# Patient Record
Sex: Male | Born: 1965 | Race: White | Hispanic: No | Marital: Married
Health system: Southern US, Community
[De-identification: ages and names within clinical notes are randomized; demographics above are authoritative.]

---

## 2007-06-15 ENCOUNTER — Encounter: Admission: RE | Admit: 2007-06-15 | Discharge: 2007-06-15 | Payer: Self-pay | Admitting: Otolaryngology

## 2009-01-23 IMAGING — CT CT PARANASAL SINUSES LIMITED
1 series · 16 of 24 positions shown, 20 images · IV contrast (agent unspecified)
Comparison: None.

CLINICAL DATA: Congestion. 
 CT SINUS LIMITED WITHOUT CONTRAST:
TECHNIQUE: Limited coronal CT images were obtained through the paranasal sinuses without intravenous contrast.

[Series 2: limited sinus · axial · 0.33mm/px · z∈[-4,+101]mm · 16 of 24 slices shown, 20 images]
[im 2/24  brain]
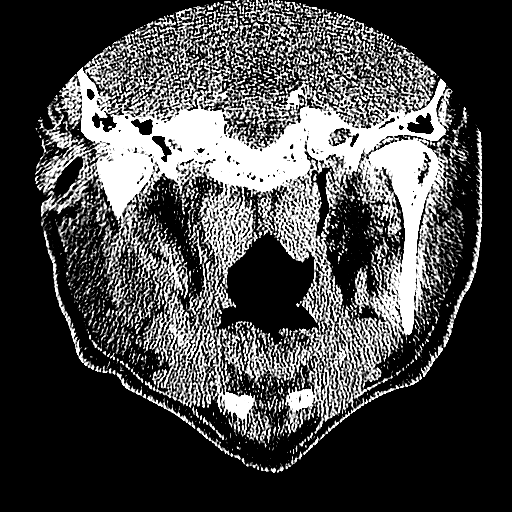
[im 2/24  bone]
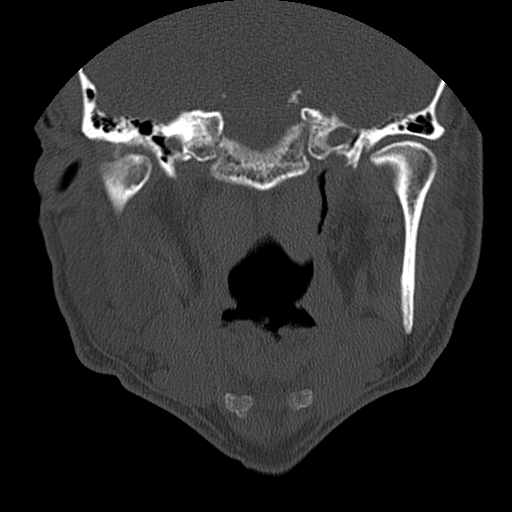
[im 4/24  bone]
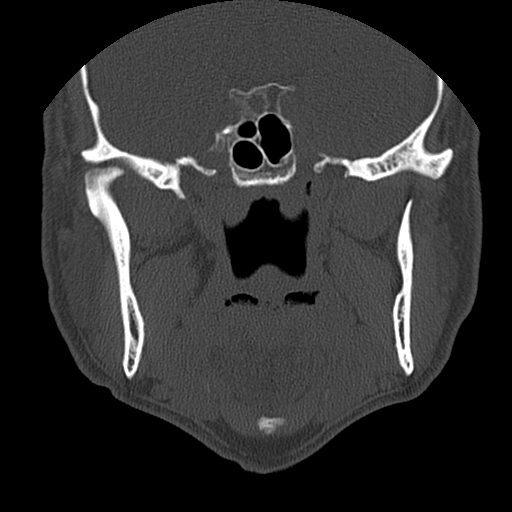
[im 5/24  bone]
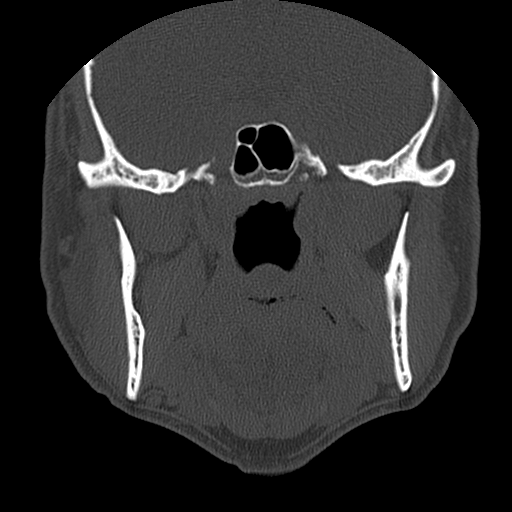
[im 6/24  bone]
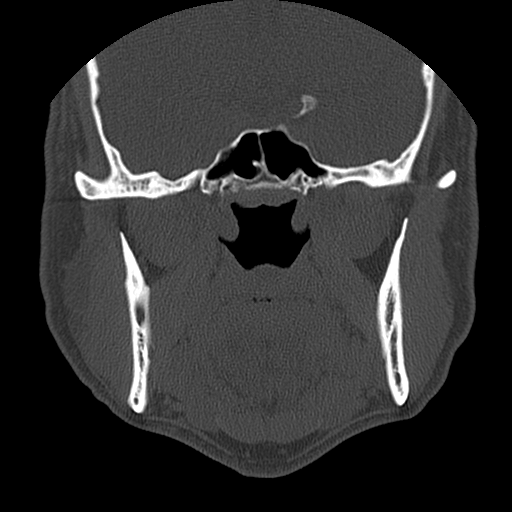
[im 8/24  brain]
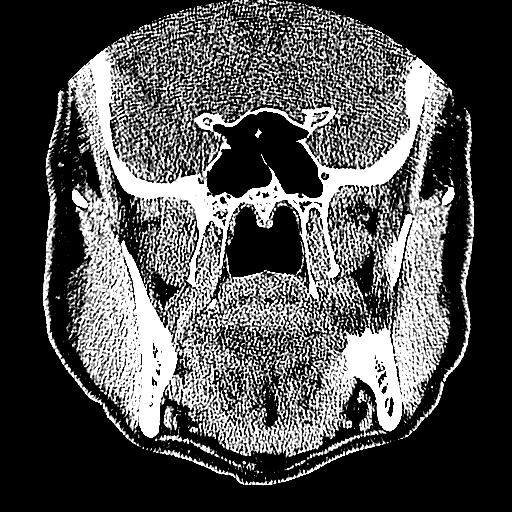
[im 8/24  bone]
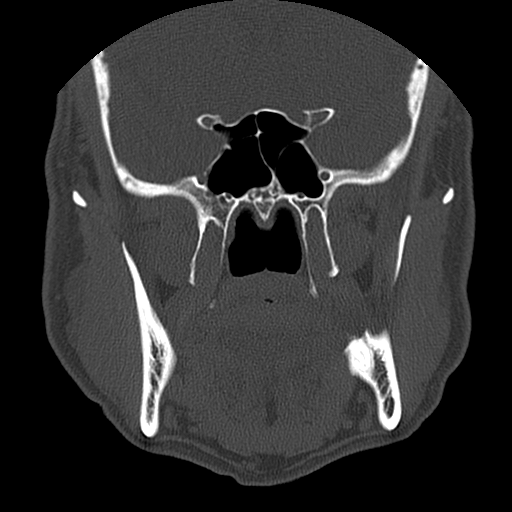
[im 9/24  bone]
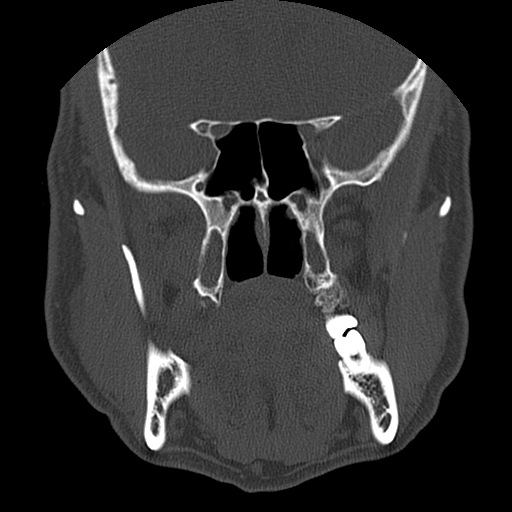
[im 10/24  bone]
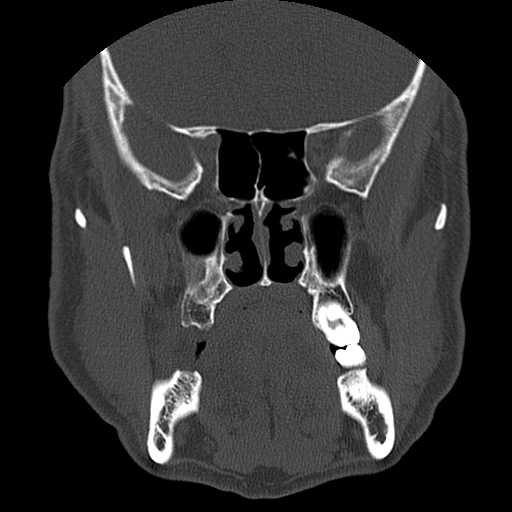
[im 12/24  bone]
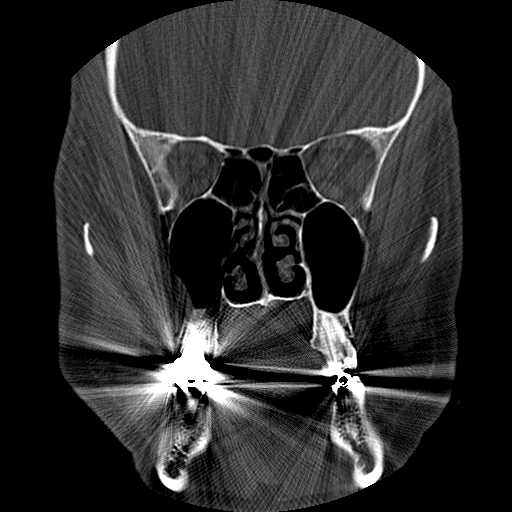
[im 13/24  brain]
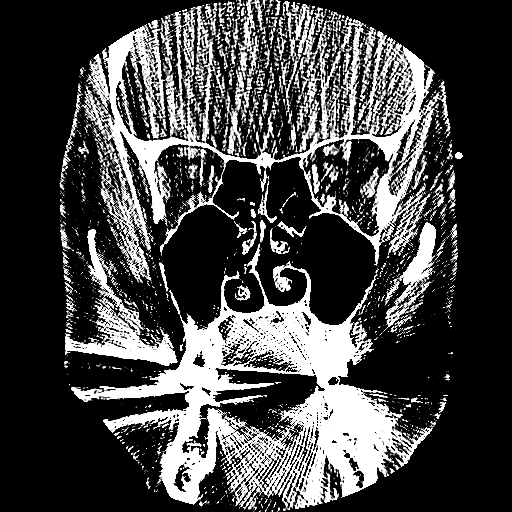
[im 13/24  bone]
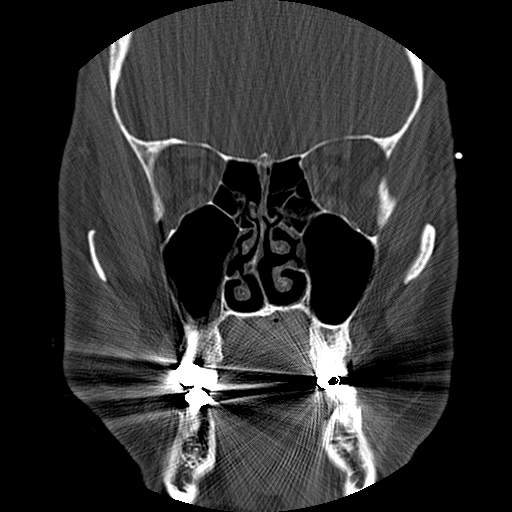
[im 15/24  bone]
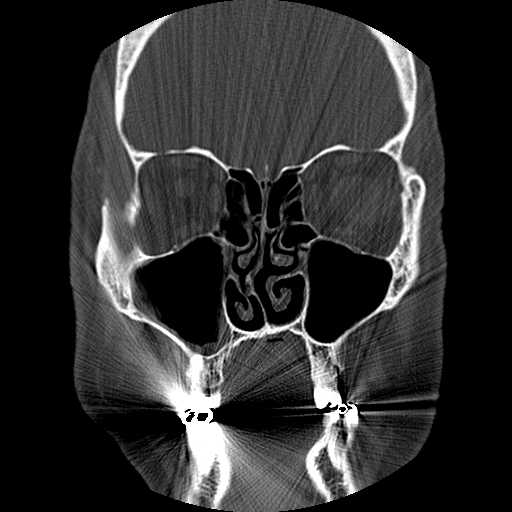
[im 16/24  bone]
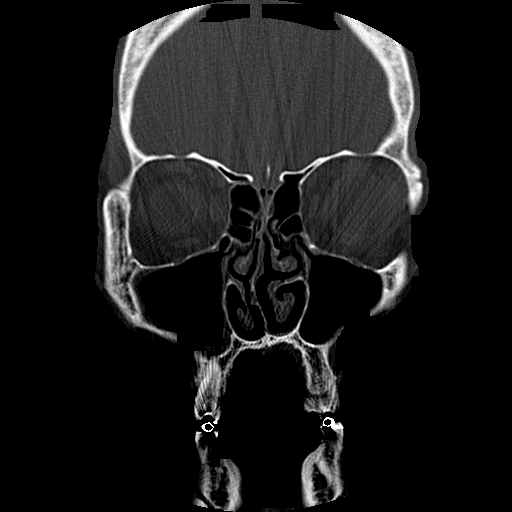
[im 17/24  bone]
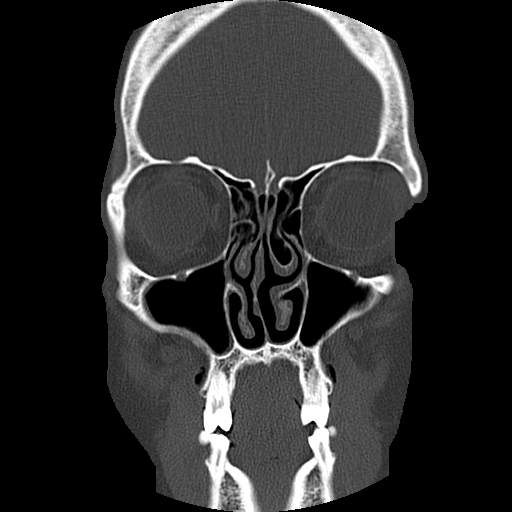
[im 19/24  brain]
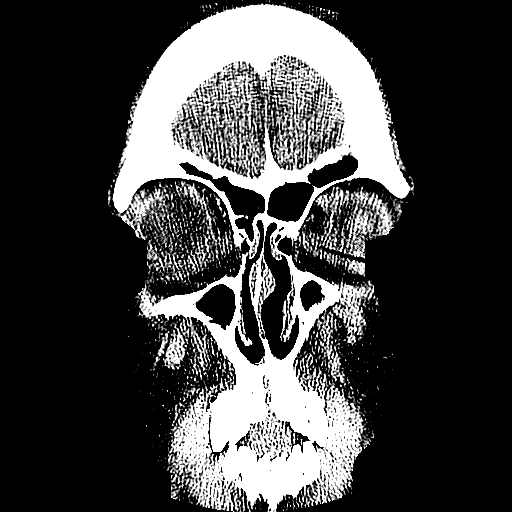
[im 19/24  bone]
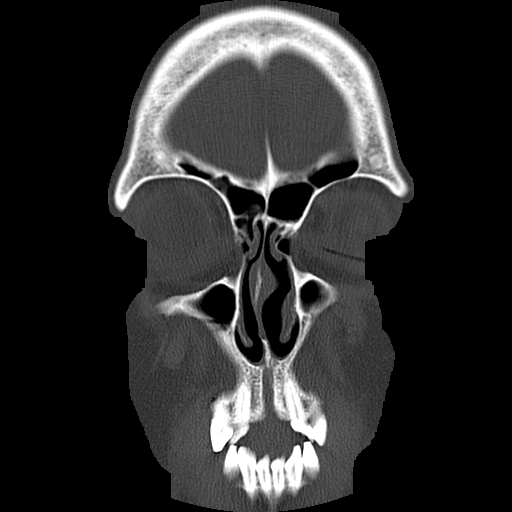
[im 20/24  bone]
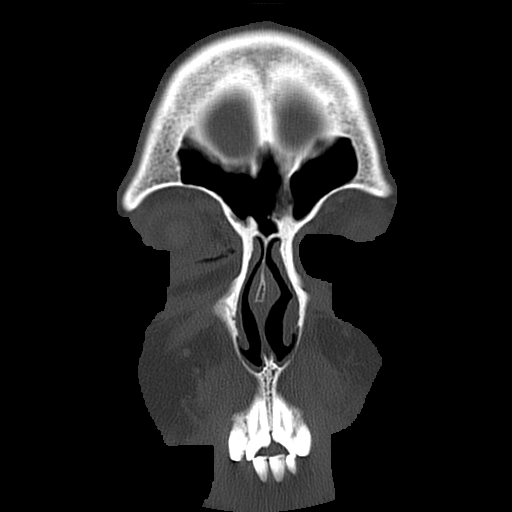
[im 21/24  bone]
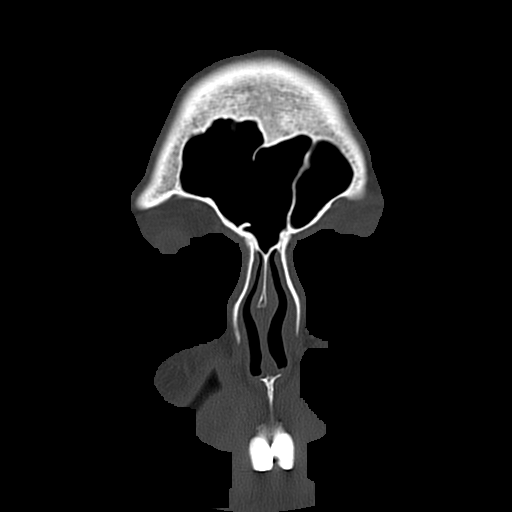
[im 23/24  bone]
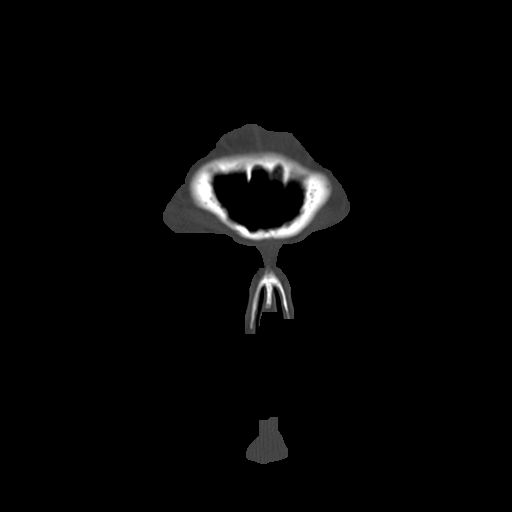

[16 of 24 positions shown; findings below may reference images not displayed]

FINDINGS: Mild mucosal thickening is seen in the left maxillary sinus.  There is mild mucosal thickening in the osteomeatal complex bilaterally.  No significant mucosal thickening elsewhere.  No air fluid levels are seen.  The nasal septum shows deviation to the left.  No bony changes are identified.
IMPRESSION: Chronic sinusitis with mucosal edema in the left maxillary sinus and in the osteomeatal complex bilaterally.

## 2012-11-24 ENCOUNTER — Ambulatory Visit (HOSPITAL_BASED_OUTPATIENT_CLINIC_OR_DEPARTMENT_OTHER)
Admission: RE | Admit: 2012-11-24 | Discharge: 2012-11-24 | Disposition: A | Discharge status: Home / Self Care | Payer: BC Managed Care – PPO | Source: Ambulatory Visit | Attending: Orthopedic Surgery | Admitting: Orthopedic Surgery

## 2012-11-24 ENCOUNTER — Encounter (HOSPITAL_BASED_OUTPATIENT_CLINIC_OR_DEPARTMENT_OTHER)
Admission: RE | Disposition: A | Discharge status: Home / Self Care | Payer: Self-pay | Source: Ambulatory Visit | Attending: Orthopedic Surgery

## 2012-11-24 ENCOUNTER — Encounter (HOSPITAL_BASED_OUTPATIENT_CLINIC_OR_DEPARTMENT_OTHER): Payer: Self-pay | Admitting: Orthopedic Surgery

## 2012-11-24 DIAGNOSIS — Y92009 Unspecified place in unspecified non-institutional (private) residence as the place of occurrence of the external cause: Secondary | ICD-10-CM | POA: Insufficient documentation

## 2012-11-24 DIAGNOSIS — W268XXA Contact with other sharp object(s), not elsewhere classified, initial encounter: Secondary | ICD-10-CM | POA: Insufficient documentation

## 2012-11-24 DIAGNOSIS — S61209A Unspecified open wound of unspecified finger without damage to nail, initial encounter: Secondary | ICD-10-CM | POA: Insufficient documentation

## 2012-11-24 HISTORY — PX: LESION REMOVAL: SHX5196

## 2012-11-24 SURGERY — MINOR EXCISION OF LESION
Anesthesia: LOCAL | Site: Finger | Laterality: Right | Wound class: Dirty or Infected

## 2012-11-24 MED ORDER — SULFAMETHOXAZOLE-TRIMETHOPRIM 800-160 MG PO TABS: 1.0000 | ORAL_TABLET | Freq: Two times a day (BID) | ORAL | Status: DC

## 2012-11-24 MED ORDER — HYDROCODONE-ACETAMINOPHEN 5-325 MG PO TABS: ORAL_TABLET | ORAL | Status: DC

## 2012-11-24 MED ORDER — LIDOCAINE HCL 1 % IJ SOLN
INTRAMUSCULAR | Status: DC | PRN
  Administered 2012-11-24: 15:00:00

## 2012-11-24 SURGICAL SUPPLY — 26 items
BANDAGE COBAN STERILE 2 (GAUZE/BANDAGES/DRESSINGS) ×2 IMPLANT
BLADE SURG 15 STRL LF DISP TIS (BLADE) ×1 IMPLANT
BLADE SURG 15 STRL SS (BLADE) ×1
CORDS BIPOLAR (ELECTRODE) ×2 IMPLANT
COVER MAYO STAND STRL (DRAPES) ×2 IMPLANT
COVER TABLE BACK 60X90 (DRAPES) ×2 IMPLANT
DRAIN PENROSE 1/4X12 LTX STRL (WOUND CARE) ×2 IMPLANT
DRAPE EXTREMITY T 121X128X90 (DRAPE) ×2 IMPLANT
GAUZE XEROFORM 1X8 LF (GAUZE/BANDAGES/DRESSINGS) ×2 IMPLANT
GLOVE BIO SURGEON STRL SZ 6.5 (GLOVE) ×2 IMPLANT
GLOVE BIO SURGEON STRL SZ7 (GLOVE) ×2 IMPLANT
GLOVE BIO SURGEON STRL SZ7.5 (GLOVE) ×2 IMPLANT
GLOVE INDICATOR 7.0 STRL GRN (GLOVE) ×2 IMPLANT
GOWN BRE IMP PREV XXLGXLNG (GOWN DISPOSABLE) ×2 IMPLANT
GOWN PREVENTION PLUS XLARGE (GOWN DISPOSABLE) ×4 IMPLANT
NEEDLE HYPO 25X1 1.5 SAFETY (NEEDLE) ×2 IMPLANT
NS IRRIG 1000ML POUR BTL (IV SOLUTION) ×2 IMPLANT
PACK BASIN DAY SURGERY FS (CUSTOM PROCEDURE TRAY) ×2 IMPLANT
SPONGE GAUZE 4X4 12PLY (GAUZE/BANDAGES/DRESSINGS) ×2 IMPLANT
STOCKINETTE 4X48 STRL (DRAPES) ×2 IMPLANT
SUT MNCRL 6-0 UNDY P1 1X18 (SUTURE) ×1 IMPLANT
SUT MONOCRYL 6-0 P1 1X18 (SUTURE) ×1
SYR BULB 3OZ (MISCELLANEOUS) ×2 IMPLANT
SYR CONTROL 10ML LL (SYRINGE) ×2 IMPLANT
TRAY DSU PREP LF (CUSTOM PROCEDURE TRAY) ×2 IMPLANT
UNDERPAD 30X30 INCONTINENT (UNDERPADS AND DIAPERS) ×2 IMPLANT

## 2012-11-24 NOTE — Op Note (Signed)
[id number]

## 2012-11-24 NOTE — Brief Op Note (Signed)
11/24/2012  3:43 PM  PATIENT:  Shannon Yoder  47 y.o. male  PRE-OPERATIVE DIAGNOSIS:  right middle finger laceration   POST-OPERATIVE DIAGNOSIS:  right middle finger laceration   PROCEDURE:  Procedure(s) (LRB) with comments: MINOR EXICISION OF LESION (Right) - laceration repair right middle finger  SURGEON:  Surgeon(s) and Role:    * Tami Ribas, MD - Primary  PHYSICIAN ASSISTANT:   ASSISTANTS: none   ANESTHESIA:   local  EBL:     BLOOD ADMINISTERED:none  DRAINS: none   LOCAL MEDICATIONS USED:  MARCAINE   , XYLOCAINE   SPECIMEN:  No Specimen  DISPOSITION OF SPECIMEN:  N/A  COUNTS:  YES  TOURNIQUET:  * No tourniquets in log *  DICTATION: .Other Dictation: Dictation Number [id number]  PLAN OF CARE: Discharge to home after PACU  PATIENT DISPOSITION:  home
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
## 2012-11-25 ENCOUNTER — Encounter (HOSPITAL_BASED_OUTPATIENT_CLINIC_OR_DEPARTMENT_OTHER): Payer: Self-pay | Admitting: Orthopedic Surgery

## 2012-11-25 NOTE — Op Note (Signed)
NAMECHRIST, Shannon NO.:  [id number]  MEDICAL RECORD NO.:  [id number]  LOCATION:                                 FACILITY:  PHYSICIAN:  Betha Loa, MD        DATE OF BIRTH:  08/06/1966  DATE OF PROCEDURE:  11/24/2012 DATE OF DISCHARGE:                              OPERATIVE REPORT   PREOPERATIVE DIAGNOSIS:  Right long finger pad laceration.  POSTOPERATIVE DIAGNOSIS:  Right long finger pad laceration.  PROCEDURE:  Irrigation and debridement and repair of right long finger laceration.  SURGEON:  Betha Loa, MD  ASSISTANT:  None.  ANESTHESIA:  Digital block with 10 mL of half and half solution 1% plain Xylocaine, 0.25% plain Marcaine.  TOURNIQUET:  Penrose drain for approximately 10 minutes.  DISPOSITION:  Stable.  INDICATIONS:  Shannon Yoder is a 47 year old male who lacerated his finger this morning.  He suffered a laceration to the pad of the finger.  He called the office for evaluation.  He was evaluated in the [facility].  He had laceration over the pad of the right long finger.  He was able to flex at the PIP and DIP joints.  He could flex against resistance without pain.  He had brisk capillary refill in the fingertip.  He had intact sensation, felt a little bit funny, he thinks because the dressing was tight on it.  There was no gross contamination.  Recommended irrigation and debridement of the wound and closure of the wound under digital block.  Risks, benefits, and alternative of procedure were discussed including risk of blood loss, infection, damage to nerves, vessels, tendons, ligaments, bone; failure of surgery; need for additional surgery, complications with wound healing, and continued pain.  He voiced understanding of these risks and elected to proceed.  PROCEDURE NOTE:  Shannon Yoder was transferred to the operating room, placed on the operating room table in supine position with the right upper extremity on an arm board.  Surgical  pause was performed between myself, the patient, and the operating room staff and all were in agreement as to the patient, procedure, and site of procedure.  Digital block was performed with 10 mL of half and half solution 1% plain Xylocaine,  and 0.25% plain Marcaine.  This was adequate to give total digital anesthesia to the long finger.  The hand was prepped and draped in normal sterile orthopedic fashion using Betadine.  Penrose drain was placed at the proximal aspect of the long finger and was up for approximately 10 minutes.  The wound was explored.  There was no gross contamination. The wound coursed down to the bone.  The tendon was not visible.  I saw no foreign bodies.  Prior to prepping and draping, the C-arm was used in AP and lateral projections to look for any radiopaque foreign body which there was none.  No fractures are noted.  The wound was copiously irrigated with a 1000 mL of sterile saline.  A 6-0 Monocryl suture was used in an interrupted fashion to repair the wound edges.  This approximated all skin edges well.  The wound was then dressed with sterile Xeroform,  4x4, and wrapped with Coban dressing lightly.  An AlumaFoam splint was placed at the DIP joint for comfort and wrapped with the Coban lightly as well. Penrose drain was removed.  The operative drapes were broken down.  The patient tolerated procedure well.  I will see him back in the office in 1 week for postprocedural follow up.  I will give him Norco 5/325 one to two p.o. q.6 h. p.r.n. pain, dispense #20.     Betha Loa, MD     KK/MEDQ  D:  11/24/2012  T:  11/24/2012  Job:  [id number]

## 2018-07-18 ENCOUNTER — Ambulatory Visit (INDEPENDENT_AMBULATORY_CARE_PROVIDER_SITE_OTHER): Payer: BLUE CROSS/BLUE SHIELD | Admitting: Sports Medicine

## 2018-07-18 ENCOUNTER — Ambulatory Visit
Admission: RE | Admit: 2018-07-18 | Discharge: 2018-07-18 | Disposition: A | Discharge status: Home / Self Care | Payer: BLUE CROSS/BLUE SHIELD | Source: Ambulatory Visit | Attending: Sports Medicine | Admitting: Sports Medicine

## 2018-07-18 VITALS — BP 112/82 | Ht 68.0 in | Wt 165.0 lb

## 2018-07-18 DIAGNOSIS — M79645 Pain in left finger(s): Secondary | ICD-10-CM | POA: Diagnosis not present

## 2018-07-18 MED ORDER — IBUPROFEN 800 MG PO TABS: 800.0000 mg | ORAL_TABLET | Freq: Two times a day (BID) | ORAL | 0 refills | Status: AC | PRN
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
## 2018-08-01 ENCOUNTER — Ambulatory Visit (INDEPENDENT_AMBULATORY_CARE_PROVIDER_SITE_OTHER): Payer: BLUE CROSS/BLUE SHIELD | Admitting: Sports Medicine

## 2018-08-01 VITALS — BP 114/86 | Ht 68.0 in | Wt 165.0 lb

## 2018-08-01 DIAGNOSIS — M79645 Pain in left finger(s): Secondary | ICD-10-CM | POA: Diagnosis not present
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
## 2023-08-17 ENCOUNTER — Other Ambulatory Visit: Payer: Self-pay | Admitting: Chiropractic Medicine

## 2023-08-17 DIAGNOSIS — R220 Localized swelling, mass and lump, head: Secondary | ICD-10-CM

## 2023-08-27 ENCOUNTER — Other Ambulatory Visit: Payer: Self-pay | Admitting: Chiropractic Medicine

## 2023-08-27 ENCOUNTER — Ambulatory Visit
Admission: RE | Admit: 2023-08-27 | Discharge: 2023-08-27 | Disposition: A | Discharge status: Home / Self Care | Payer: BC Managed Care – PPO | Source: Ambulatory Visit | Attending: Chiropractic Medicine | Admitting: Chiropractic Medicine

## 2023-08-27 DIAGNOSIS — R220 Localized swelling, mass and lump, head: Secondary | ICD-10-CM
